# Patient Record
Sex: Female | Born: 1965 | Race: Black or African American | Hispanic: No | Marital: Single | Smoking: Former smoker
Health system: Southern US, Community
[De-identification: ages and names within clinical notes are randomized; demographics above are authoritative.]

## PROBLEM LIST (undated history)

## (undated) DIAGNOSIS — J45909 Unspecified asthma, uncomplicated: Secondary | ICD-10-CM

## (undated) DIAGNOSIS — E119 Type 2 diabetes mellitus without complications: Secondary | ICD-10-CM

## (undated) DIAGNOSIS — R42 Dizziness and giddiness: Secondary | ICD-10-CM

## (undated) DIAGNOSIS — I10 Essential (primary) hypertension: Secondary | ICD-10-CM

## (undated) HISTORY — PX: ABDOMINAL HYSTERECTOMY: SHX81

---

## 2016-10-15 ENCOUNTER — Encounter: Payer: Self-pay | Admitting: *Deleted

## 2016-10-15 ENCOUNTER — Ambulatory Visit
Admission: EM | Admit: 2016-10-15 | Discharge: 2016-10-15 | Disposition: A | Discharge status: Home / Self Care | Payer: BLUE CROSS/BLUE SHIELD | Attending: Family Medicine | Admitting: Family Medicine

## 2016-10-15 DIAGNOSIS — E119 Type 2 diabetes mellitus without complications: Secondary | ICD-10-CM | POA: Diagnosis not present

## 2016-10-15 DIAGNOSIS — Z7984 Long term (current) use of oral hypoglycemic drugs: Secondary | ICD-10-CM | POA: Diagnosis not present

## 2016-10-15 DIAGNOSIS — R42 Dizziness and giddiness: Secondary | ICD-10-CM | POA: Insufficient documentation

## 2016-10-15 DIAGNOSIS — Z79899 Other long term (current) drug therapy: Secondary | ICD-10-CM | POA: Diagnosis not present

## 2016-10-15 DIAGNOSIS — Z87891 Personal history of nicotine dependence: Secondary | ICD-10-CM | POA: Insufficient documentation

## 2016-10-15 DIAGNOSIS — I10 Essential (primary) hypertension: Secondary | ICD-10-CM | POA: Diagnosis not present

## 2016-10-15 HISTORY — DX: Essential (primary) hypertension: I10

## 2016-10-15 HISTORY — DX: Type 2 diabetes mellitus without complications: E11.9

## 2016-10-15 LAB — GLUCOSE, CAPILLARY: Glucose-Capillary: 99 mg/dL (ref 65–99)

## 2016-10-15 NOTE — Discharge Instructions (Signed)
Increase water intake

## 2016-10-15 NOTE — ED Triage Notes (Signed)
PAtient awoke this AM with dizziness without resolution.

## 2016-10-15 NOTE — ED Provider Notes (Signed)
[facility]    CSN: [id number] Arrival date & time: 10/15/16  1109     History   Chief Complaint Chief Complaint  Patient presents with  . Dizziness    HPI Dana Wagner is a 51 y.o. female.   50 yo female with a c/o lightheadedness/dizziness this morning as she was walking around in her home. States she suddenly felt "a little whoozy sensation" which she states has now resolved. She denies any chest pains, shortness of breath, fevers, chills, numbness/tingling, one-sided weakness, vision changes. States she drove herself here and now symptoms have resolved.    The history is provided by the patient.    Past Medical History:  Diagnosis Date  . Diabetes mellitus without complication (HCC)   . Hypertension     There are no active problems to display for this patient.   Past Surgical History:  Procedure Laterality Date  . ABDOMINAL HYSTERECTOMY      OB History    No data available       Home Medications    Prior to Admission medications   Medication Sig Start Date End Date Taking? Authorizing Provider  amLODipine (NORVASC) 10 MG tablet Take 10 mg by mouth daily.   Yes [provider]  aspirin EC 81 MG tablet Take 81 mg by mouth daily.   Yes [provider]  lisinopril (PRINIVIL,ZESTRIL) 5 MG tablet Take 5 mg by mouth daily.   Yes [provider]  metFORMIN (GLUCOPHAGE) 500 MG tablet Take 500 mg by mouth 2 (two) times daily with a meal.   Yes [provider]    Family History History reviewed. No pertinent family history.  Social History Social History  Substance Use Topics  . Smoking status: Former Games developer  . Smokeless tobacco: Never Used  . Alcohol use Yes     Allergies   Hctz [hydrochlorothiazide]   Review of Systems Review of Systems   Physical Exam Triage Vital Signs ED Triage Vitals  Enc Vitals Group     BP 10/15/16 1129 121/74     Pulse Rate 10/15/16 1129 74     Resp 10/15/16 1129 16       Temp 10/15/16 1129 97.8 F (36.6 C)     Temp Source 10/15/16 1129 Oral     SpO2 10/15/16 1129 99 %     Weight 10/15/16 1130 298 lb (135.2 kg)     Height 10/15/16 1130  (1.626 m)     Head Circumference --      Peak Flow --      Pain Score 10/15/16 1131 0     Pain Loc --      Pain Edu? --      Excl. in GC? --    No data found.   Updated Vital Signs BP 121/74 (BP Location: Left Arm)   Pulse 74   Temp 97.8 F (36.6 C) (Oral)   Resp 16   Ht  (1.626 m)   Wt 298 lb (135.2 kg)   SpO2 99%   BMI 51.15 kg/m   Visual Acuity Right Eye Distance:   Left Eye Distance:   Bilateral Distance:    Right Eye Near:   Left Eye Near:    Bilateral Near:     Physical Exam  Constitutional: She is oriented to person, place, and time. She appears well-developed and well-nourished. No distress.  HENT:  Head: Normocephalic.  Right Ear: Tympanic membrane, external ear and ear canal normal.  Left  Ear: Tympanic membrane, external ear and ear canal normal.  Nose: Nose normal.  Mouth/Throat: Oropharynx is clear and moist and mucous membranes are normal.  Eyes: Pupils are equal, round, and reactive to light. Conjunctivae and EOM are normal. Right eye exhibits no discharge. Left eye exhibits no discharge. No scleral icterus.  Neck: Normal range of motion. Neck supple. No JVD present. No tracheal deviation present. No thyromegaly present.  Cardiovascular: Normal rate, regular rhythm, normal heart sounds and intact distal pulses.   No murmur heard. Pulmonary/Chest: Effort normal and breath sounds normal. No stridor. No respiratory distress. She has no wheezes. She has no rales. She exhibits no tenderness.  Musculoskeletal: She exhibits no edema or tenderness.  Lymphadenopathy:    She has no cervical adenopathy.  Neurological: She is alert and oriented to person, place, and time. She has normal reflexes. She displays normal reflexes. No cranial nerve deficit or sensory deficit. She exhibits  normal muscle tone. Coordination normal.  Skin: Skin is warm and dry. No rash noted. She is not diaphoretic. No erythema. No pallor.  Psychiatric: She has a normal mood and affect.  Vitals reviewed.    UC Treatments / Results  Labs (all labs ordered are listed, but only abnormal results are displayed) Labs Reviewed  GLUCOSE, CAPILLARY  CBG MONITORING, ED    EKG  EKG Interpretation None       Radiology No results found.  Procedures Procedures (including critical care time)  Medications Ordered in UC Medications - No data to display   Initial Impression / Assessment and Plan / UC Course  I have reviewed the triage vital signs and the nursing notes.  Pertinent labs & imaging results that were available during my care of the patient were reviewed by me and considered in my medical decision making (see chart for details).       Final Clinical Impressions(s) / UC Diagnoses   Final diagnoses:  Lightheadedness  (resolved)  New Prescriptions Discharge Medication List as of 10/15/2016 12:57 PM     1. Lab (CBG=99) and diagnosis reviewed with patient 2. Recommend supportive treatment with increased fluids/water and monitor for recurrence of symptoms 3. Follow-up prn if symptoms worsen or don't improve  Controlled Substance Prescriptions [place] Controlled Substance Registry consulted? Not Applicable   Payton Mccallum, MD 10/15/16 [phone]

## 2016-11-26 ENCOUNTER — Ambulatory Visit
Admission: EM | Admit: 2016-11-26 | Discharge: 2016-11-26 | Disposition: A | Discharge status: Home / Self Care | Payer: BLUE CROSS/BLUE SHIELD | Attending: Emergency Medicine | Admitting: Emergency Medicine

## 2016-11-26 ENCOUNTER — Other Ambulatory Visit: Payer: Self-pay

## 2016-11-26 DIAGNOSIS — R51 Headache: Secondary | ICD-10-CM | POA: Insufficient documentation

## 2016-11-26 DIAGNOSIS — Z79899 Other long term (current) drug therapy: Secondary | ICD-10-CM | POA: Insufficient documentation

## 2016-11-26 DIAGNOSIS — Z87891 Personal history of nicotine dependence: Secondary | ICD-10-CM | POA: Diagnosis not present

## 2016-11-26 DIAGNOSIS — I10 Essential (primary) hypertension: Secondary | ICD-10-CM | POA: Insufficient documentation

## 2016-11-26 DIAGNOSIS — R42 Dizziness and giddiness: Secondary | ICD-10-CM | POA: Diagnosis not present

## 2016-11-26 DIAGNOSIS — E119 Type 2 diabetes mellitus without complications: Secondary | ICD-10-CM | POA: Insufficient documentation

## 2016-11-26 DIAGNOSIS — J45909 Unspecified asthma, uncomplicated: Secondary | ICD-10-CM | POA: Diagnosis not present

## 2016-11-26 DIAGNOSIS — R110 Nausea: Secondary | ICD-10-CM | POA: Diagnosis not present

## 2016-11-26 DIAGNOSIS — Z90710 Acquired absence of both cervix and uterus: Secondary | ICD-10-CM | POA: Insufficient documentation

## 2016-11-26 DIAGNOSIS — Z7982 Long term (current) use of aspirin: Secondary | ICD-10-CM | POA: Diagnosis not present

## 2016-11-26 DIAGNOSIS — Z888 Allergy status to other drugs, medicaments and biological substances status: Secondary | ICD-10-CM | POA: Diagnosis not present

## 2016-11-26 HISTORY — DX: Dizziness and giddiness: R42

## 2016-11-26 LAB — GLUCOSE, CAPILLARY: Glucose-Capillary: 100 mg/dL — ABNORMAL HIGH (ref 65–99)

## 2016-11-26 MED ORDER — DIAZEPAM 2 MG PO TABS: 1.0000 mg | ORAL_TABLET | Freq: Two times a day (BID) | ORAL | 0 refills | Status: AC | PRN

## 2016-11-26 NOTE — ED Provider Notes (Signed)
HPI  SUBJECTIVE:  Dana Wagner is a 51 y.o. female who presents with dizziness described as being off balance, that she feels like she is severely intoxicated.  This is been going on for 5 days, has been constant.  States that it waxes and wanes.  She reports occasional palpitations, mild headache starting yesterday.  However she was having the dizziness prior to the headache.  She reports intermittent, mild nausea. Patient was seen in the ER 5 days ago for dizziness.  Labs including tox screen, beta hCG, UA, CBC, CMP, troponin were unremarkable, EKG normal, she was given IV fluids with improvement in her symptoms.  She was thought to have postural dizziness with presyncope and vertigo.  She was prescribed meclizine.  Patient states that the meclizine is not working.  She denies vertigo as in the room spinning around her, chest pain, shortness of breath.  No syncope.  No dysarthria, aphasia, arm or leg weakness, facial droop, or severe headache.  No visual changes, vomiting, tinnitus.  No ear pain, fullness, recent viral illness.  She recently finished some Keflex for a lip sore, she does not take any NSAIDs, she is not on any diuretics.  No change in her medications.  She has tried meclizine.  Symptoms are better with IV fluids.  Symptoms are worse with turning her head, going from sitting to standing, walking.  She has a past medical history of asthma, hypertension, prediabetes, vertigo/dizziness.  She states that this episode is very similar to previous episodes however it is more intense than usual.  States that her dizziness is lasted for days in the past.  No history of coronary artery disease, hypercholesterolemia, peripheral vascular disease, peripheral arterial disease, aneurysm, arrhythmia, MI, stroke.  Family history negative for stroke.  PMD: PCP.  Past Medical History:  Diagnosis Date  . Diabetes mellitus without complication (HCC)   . Hypertension   . Vertigo     Past Surgical History:   Procedure Laterality Date  . ABDOMINAL HYSTERECTOMY      No family history on file.  Social History   Tobacco Use  . Smoking status: Former Games developermoker  . Smokeless tobacco: Never Used  Substance Use Topics  . Alcohol use: No    Frequency: Never  . Drug use: No    No current facility-administered medications for this encounter.   Current Outpatient Medications:  .  amLODipine (NORVASC) 10 MG tablet, Take 10 mg by mouth daily., Disp: , Rfl:  .  aspirin EC 81 MG tablet, Take 81 mg by mouth daily., Disp: , Rfl:  .  Fluticasone Furoate-Vilanterol (BREO ELLIPTA IN), Inhale into the lungs., Disp: , Rfl:  .  lisinopril (PRINIVIL,ZESTRIL) 5 MG tablet, Take 5 mg by mouth daily., Disp: , Rfl:  .  metFORMIN (GLUCOPHAGE) 500 MG tablet, Take 500 mg by mouth 2 (two) times daily with a meal., Disp: , Rfl:  .  diazepam (VALIUM) 2 MG tablet, Take 0.5 tablets (1 mg total) every 12 (twelve) hours as needed for up to 10 days by mouth (Vertigo/dizziness)., Disp: 10 tablet, Rfl: 0  Allergies  Allergen Reactions  . Hctz [Hydrochlorothiazide] Other (See Comments)    Causes headache     ROS  As noted in HPI.   Physical Exam  BP (!) 149/84 (BP Location: Left Arm)   Pulse 76   Temp (!) 97.5 F (36.4 C) (Oral)   Ht 5\' 4"  (1.626 m)   Wt 298 lb (135.2 kg)   SpO2 96%  BMI 51.15 kg/m   Orthostatic VS for the past 24 hrs:  BP- Lying Pulse- Lying BP- Sitting Pulse- Sitting BP- Standing at 0 minutes Pulse- Standing at 0 minutes  11/26/16 1005 144/84 82 149/86 81 147/80 85   Constitutional: Well developed, well nourished, no acute distress Eyes:  EOMI, conjunctiva normal bilaterally HENT: Normocephalic, atraumatic,mucus membranes moist Respiratory: Normal inspiratory effort, lungs clear bilaterally Cardiovascular: Normal rate and rhythm no murmurs rubs or gallops.  No carotid bruit GI: nondistended skin: No rash, skin intact Musculoskeletal: no deformities Neurologic: Alert & oriented x 3, no  nerves II through XII intact, finger-nose, heel shin within normal limits. Difficulty with tandem gait but able to do independently, romberg negative. Dix hallpike positive bilaterally, worse on the left.  Patient states that she feels better after the Dix-Hallpike. Psychiatric: Speech and behavior appropriate   ED Course   Medications - No data to display  Orders Placed This Encounter  Procedures  . Glucose, capillary    Standing Status:   Standing    Number of Occurrences:   1  . Orthostatic vital signs    Standing Status:   Standing    Number of Occurrences:   1  . CBG monitoring, ED    Standing Status:   Standing    Number of Occurrences:   1  . ED EKG    dizziness    Standing Status:   Standing    Number of Occurrences:   1    Order Specific Question:   Reason for Exam    Answer:   Other (See Comments)  . EKG 12-Lead    Standing Status:   Standing    Number of Occurrences:   1    Results for orders placed or performed during the hospital encounter of 11/26/16 (from the past 24 hour(s))  Glucose, capillary     Status: Abnormal   Collection Time: 11/26/16  9:49 AM  Result Value Ref Range   Glucose-Capillary 100 (H) 65 - 99 mg/dL   Comment 1 Document in Chart    No results found.  ED Clinical Impression  Dizziness   ED Assessment/Plan  Outside records reviewed as noted in HPI.  EKG: Normal sinus rhythm, normal axis, normal intervals.  No hypertrophy.  No ST-T wave changes.  No previous EKG for comparison  Patient is not orthostatic.   Gave patient the option of going to the ED to rule out central cause of her symptoms the meclizine has not been helping however, Favor peripheral cause of her vertigo given the reproducibility of her symptoms.   States that she feels better after doing the Dix-Hallpike.  She was dependently ambulatory without any problems after Dix-Hallpike.  She is completely neurologically intact, EKG is reassuring, glucose is normal.  She has had  a comprehensive workup recently, so labs were not repeated today.  Doubt cerebellar stroke as her cerebellar functions are intact. will try Valium instead of the meclizine, Epley maneuver on the left side-gave handout, follow-up with PMD as scheduled in 2 days.  She will go immediately to the ER for any worsening of her symptoms.  Discussed labs,  MDM, plan and followup with patient. Discussed sn/sx that should prompt return to the ED. patient agrees with plan.   Meds ordered this encounter  Medications  . DISCONTD: meclizine (ANTIVERT) 25 MG tablet    Sig: Take 25 mg 3 (three) times daily as needed by mouth for dizziness.  . Fluticasone Furoate-Vilanterol (BREO ELLIPTA IN)  Sig: Inhale into the lungs.  . diazepam (VALIUM) 2 MG tablet    Sig: Take 0.5 tablets (1 mg total) every 12 (twelve) hours as needed for up to 10 days by mouth (Vertigo/dizziness).    Dispense:  10 tablet    Refill:  0    *This clinic note was created using Scientist, clinical (histocompatibility and immunogenetics)Dragon dictation software. Therefore, there may be occasional mistakes despite careful proofreading.   ?    Domenick GongMortenson, Eyoel Throgmorton, MD 11/26/16 [id number]

## 2016-11-26 NOTE — Discharge Instructions (Signed)
Try the Valium instead of the meclizine.  Do the Epley maneuver on the left side as outlined in the handout.  Watch the video online for clarification on how to do this.  Go immediately to the ER if your symptoms get worse, do not respond to the Valium and Epley maneuver, chest pain, shortness of breath, drug-like symptoms, difficulty breathing or for other concerns

## 2016-11-26 NOTE — ED Triage Notes (Signed)
Patient c/o dizziness since Tuesday 11/22/16, she was [facility] on Tuesday and was given meclizine for dizziness. Patient states she had a slight sinus HA yesterday with slight nausea. No vomiting. Per patient, the meclizine has not given relief. Patient has a history of vertigo.Worse when standing and sitting up.

## 2017-01-30 ENCOUNTER — Other Ambulatory Visit: Payer: Self-pay

## 2017-01-30 ENCOUNTER — Ambulatory Visit
Admission: EM | Admit: 2017-01-30 | Discharge: 2017-01-30 | Disposition: A | Discharge status: Home / Self Care | Payer: BLUE CROSS/BLUE SHIELD | Attending: Emergency Medicine | Admitting: Emergency Medicine

## 2017-01-30 ENCOUNTER — Encounter: Payer: Self-pay | Admitting: Emergency Medicine

## 2017-01-30 DIAGNOSIS — R69 Illness, unspecified: Secondary | ICD-10-CM | POA: Diagnosis not present

## 2017-01-30 DIAGNOSIS — M791 Myalgia, unspecified site: Secondary | ICD-10-CM | POA: Diagnosis not present

## 2017-01-30 DIAGNOSIS — J111 Influenza due to unidentified influenza virus with other respiratory manifestations: Secondary | ICD-10-CM

## 2017-01-30 DIAGNOSIS — R0981 Nasal congestion: Secondary | ICD-10-CM | POA: Diagnosis not present

## 2017-01-30 MED ORDER — FLUTICASONE PROPIONATE 50 MCG/ACT NA SUSP: 1.0000 | Freq: Every day | NASAL | 2 refills | Status: DC

## 2017-01-30 MED ORDER — OSELTAMIVIR PHOSPHATE 75 MG PO CAPS: 75.0000 mg | ORAL_CAPSULE | Freq: Two times a day (BID) | ORAL | 0 refills | Status: DC

## 2017-01-30 NOTE — ED Provider Notes (Signed)
[facility]    CSN: [id number] Arrival date & time: 01/30/17  1625     History   Chief Complaint Chief Complaint  Patient presents with  . Generalized Body Aches  . Nasal Congestion    HPI Dana Wagner is a 52 y.o. female.   Patient is a 52 year old female who presents with complaint of body aches and cold-like symptoms that began Saturday but seemed to worsen yesterday.  She reports runny nose, sneezing, but denies cough.  She does report some sweating but no chills.  She does report a history of asthma.  She states she took a guaifenesin cough medication today about noon and took ibuprofen 600 mg about every 6 hours yesterday.  She denies any chest pains or shortness of breath above her normal but does report some occasional shortness of breath with exertion last couple days since she has been feeling well.  Patient denies any abdominal pain today but does report some nausea yesterday.        Past Medical History:  Diagnosis Date  . Diabetes mellitus without complication (HCC)   . Hypertension   . Vertigo     There are no active problems to display for this patient.   Past Surgical History:  Procedure Laterality Date  . ABDOMINAL HYSTERECTOMY      OB History    No data available       Home Medications    Prior to Admission medications   Medication Sig Start Date End Date Taking? Authorizing Provider  amLODipine (NORVASC) 10 MG tablet Take 10 mg by mouth daily.   Yes [provider]  aspirin EC 81 MG tablet Take 81 mg by mouth daily.   Yes [provider]  Fluticasone Furoate-Vilanterol (BREO ELLIPTA IN) Inhale into the lungs.   Yes [provider]  lisinopril (PRINIVIL,ZESTRIL) 5 MG tablet Take 5 mg by mouth daily.   Yes [provider]  metFORMIN (GLUCOPHAGE) 500 MG tablet Take 500 mg by mouth 2 (two) times daily with a meal.   Yes [provider]  fluticasone (FLONASE) 50 MCG/ACT nasal spray Place  1 spray into both nostrils daily. 01/30/17   Candis Schatz, PA-C  oseltamivir (TAMIFLU) 75 MG capsule Take 1 capsule (75 mg total) by mouth every 12 (twelve) hours. 01/30/17   Candis Schatz, PA-C    Family History History reviewed. No pertinent family history.  Social History Social History   Tobacco Use  . Smoking status: Former Games developer  . Smokeless tobacco: Never Used  Substance Use Topics  . Alcohol use: No    Frequency: Never  . Drug use: No     Allergies   Hctz [hydrochlorothiazide]   Review of Systems Review of Systems   As noted above in HPI.  Other systems reviewed and found to be negative.  Physical Exam Triage Vital Signs ED Triage Vitals  Enc Vitals Group     BP 01/30/17 1645 (!) 146/62     Pulse Rate 01/30/17 1645 80     Resp 01/30/17 1645 16     Temp 01/30/17 1645 98.6 F (37 C)     Temp Source 01/30/17 1645 Oral     SpO2 01/30/17 1645 97 %     Weight 01/30/17 1642 298 lb (135.2 kg)     Height --      Head Circumference --      Peak Flow --      Pain Score 01/30/17 1642 3  Pain Loc --      Pain Edu? --      Excl. in GC? --    No data found.  Updated Vital Signs BP (!) 146/62 (BP Location: Left Arm)   Pulse 80   Temp 98.6 F (37 C) (Oral)   Resp 16   Wt 298 lb (135.2 kg)   SpO2 97%   BMI 51.15 kg/m   Visual Acuity Right Eye Distance:   Left Eye Distance:   Bilateral Distance:    Right Eye Near:   Left Eye Near:    Bilateral Near:     Physical Exam  Constitutional: She is oriented to person, place, and time. She appears well-nourished. No distress.  HENT:  Right Ear: Ear canal normal. A middle ear effusion is present.  Left Ear: Ear canal normal. A middle ear effusion is present.  Nose: Right sinus exhibits no maxillary sinus tenderness and no frontal sinus tenderness. Left sinus exhibits no maxillary sinus tenderness and no frontal sinus tenderness.  Mouth/Throat: Uvula is midline. No posterior oropharyngeal erythema.  Tonsils are 0 on the right. Tonsils are 0 on the left.  Some postnasal drainage  Eyes: EOM are normal. Pupils are equal, round, and reactive to light.  Cardiovascular: Normal rate, regular rhythm and normal heart sounds.  No murmur heard. Pulmonary/Chest: Effort normal and breath sounds normal. No stridor. No respiratory distress. She has no wheezes.  Neurological: She is alert and oriented to person, place, and time.  Skin: Skin is warm and dry.     UC Treatments / Results  Labs (all labs ordered are listed, but only abnormal results are displayed) Labs Reviewed - No data to display  EKG  EKG Interpretation None       Radiology No results found.  Procedures Procedures (including critical care time)  Medications Ordered in UC Medications - No data to display   Initial Impression / Assessment and Plan / UC Course  I have reviewed the triage vital signs and the nursing notes.  Pertinent labs & imaging results that were available during my care of the patient were reviewed by me and considered in my medical decision making (see chart for details).     Final Clinical Impressions(s) / UC Diagnoses   Final diagnoses:  Influenza-like illness   Patient with flulike illness since Saturday, worse than yesterday.  We will go ahead and give her prescription for Tamiflu as well as for Flonase for her upper respiratory symptoms.  ED Discharge Orders        Ordered    oseltamivir (TAMIFLU) 75 MG capsule  Every 12 hours     01/30/17 1823    fluticasone (FLONASE) 50 MCG/ACT nasal spray  Daily     01/30/17 1823       Controlled Substance Prescriptions  Controlled Substance Registry consulted? Not Applicable   Candis SchatzHarris, Michael D, PA-C 01/30/17 1824

## 2017-01-30 NOTE — ED Triage Notes (Signed)
Patient c/o bodyaches, sneezing, and nasal congestion since yesterday.  Patient denies fevers.

## 2017-01-30 NOTE — Discharge Instructions (Signed)
-  Tamiflu: 1 tablet twice daily for 5 days -Flonase: 1 spray to each nostril directed slightly towards the ears daily -Tylenol or ibuprofen for pain or fever -Fluids and rest -Follow with primary care provider or this clinic should symptoms worsen or not improve.

## 2017-02-02 ENCOUNTER — Telehealth: Payer: Self-pay

## 2017-02-02 NOTE — Telephone Encounter (Signed)
Called to follow up with patient since visit here at [facility]. Patient instructed to call back with any questions or concerns. MAH  

## 2017-11-25 ENCOUNTER — Ambulatory Visit
Admission: EM | Admit: 2017-11-25 | Discharge: 2017-11-25 | Disposition: A | Discharge status: Home / Self Care | Payer: BLUE CROSS/BLUE SHIELD | Attending: Emergency Medicine | Admitting: Emergency Medicine

## 2017-11-25 ENCOUNTER — Other Ambulatory Visit: Payer: Self-pay

## 2017-11-25 ENCOUNTER — Ambulatory Visit (INDEPENDENT_AMBULATORY_CARE_PROVIDER_SITE_OTHER): Payer: BLUE CROSS/BLUE SHIELD

## 2017-11-25 DIAGNOSIS — J45901 Unspecified asthma with (acute) exacerbation: Secondary | ICD-10-CM

## 2017-11-25 DIAGNOSIS — J069 Acute upper respiratory infection, unspecified: Secondary | ICD-10-CM | POA: Diagnosis not present

## 2017-11-25 DIAGNOSIS — Z87891 Personal history of nicotine dependence: Secondary | ICD-10-CM | POA: Diagnosis not present

## 2017-11-25 LAB — RAPID INFLUENZA A&B ANTIGENS
Influenza A (ARMC): NEGATIVE
Influenza B (ARMC): NEGATIVE

## 2017-11-25 MED ORDER — IPRATROPIUM-ALBUTEROL 0.5-2.5 (3) MG/3ML IN SOLN
3.0000 mL | Freq: Once | RESPIRATORY_TRACT | Status: AC
  Administered 2017-11-25: 3 mL via RESPIRATORY_TRACT

## 2017-11-25 MED ORDER — HYDROCOD POLST-CPM POLST ER 10-8 MG/5ML PO SUER: 5.0000 mL | Freq: Two times a day (BID) | ORAL | 0 refills | Status: DC | PRN

## 2017-11-25 MED ORDER — AEROCHAMBER PLUS MISC: 2 refills | Status: DC

## 2017-11-25 NOTE — ED Triage Notes (Addendum)
Pt states runny nose, cough, sneezing, body aches. Seen by PCP on Thursday for same. No fever. Taking Z pack, Medrol dose pack, and Tessalon. Pain 6/10.

## 2017-11-25 NOTE — ED Provider Notes (Signed)
HPI  SUBJECTIVE:  Dana Wagner is a 52 y.o. female who presents with cough productive of clear yellow phlegm, chest congestion, tightness, wheezing, shortness of breath, dyspnea on exertion accompanied with URI symptoms of clear nasal congestion, rhinorrhea, postnasal drip.  She reports body aches, no documented fevers.  Symptoms have been going on for 3 days. Patient was seen by her primary care physician 2 days ago for the same, thought to have an asthma exacerbation.  Placed on a Z-Pak to prevent this from turning into pneumonia, Medrol Dosepak, albuterol inhaler/nebulizer.  Does not have a spacer.  States that she is breathing better, but wants to make sure that she does not have the flu or pneumonia.  She has been taking the Z-Pak, she is on day #3 of this, Medrol Dosepak, Tessalon, Flonase, Mucinex, ibuprofen 400 mg.  No alleviating factors.  Symptoms are worse with lying down.  No antipyretic in the past 4 to 6 hours.  She has a past medical history of asthma for which she takes Breo, and is using her albuterol rescue inhaler every 4 hours.  She has a history of hypertension for which she takes lisinopril.  She is a prediabetic.  No smoking, vaping.  PMD: Forest Gleason, MD   Past Medical History:  Diagnosis Date  . Diabetes mellitus without complication (HCC)   . Hypertension   . Vertigo     Past Surgical History:  Procedure Laterality Date  . ABDOMINAL HYSTERECTOMY      History reviewed. No pertinent family history.  Social History   Tobacco Use  . Smoking status: Former Games developer  . Smokeless tobacco: Never Used  Substance Use Topics  . Alcohol use: No    Frequency: Never  . Drug use: No    No current facility-administered medications for this encounter.   Current Outpatient Medications:  .  albuterol (PROVENTIL) (5 MG/ML) 0.5% nebulizer solution, Inhale into the lungs., Disp: , Rfl:  .  methylPREDNISolone (MEDROL DOSEPAK) 4 MG TBPK tablet, Follow package directions., Disp:  , Rfl:  .  amLODipine (NORVASC) 10 MG tablet, Take 10 mg by mouth daily., Disp: , Rfl:  .  azithromycin (ZITHROMAX) 250 MG tablet, TAKE 2 TABLETS BY MOUTH ON DAY 1 AND THEN TAKE 1 TABLET BY MOUTH ONCE A DAY ON DAY 2 THROUGH DAY 5, Disp: , Rfl: 0 .  benzonatate (TESSALON) 200 MG capsule, TAKE 1 CAPSULE BY MOUTH THREE TIMES DAILY AS NEEDED FOR COUGH, Disp: , Rfl: 1 .  chlorpheniramine-HYDROcodone (TUSSIONEX PENNKINETIC ER) 10-8 MG/5ML SUER, Take 5 mLs by mouth every 12 (twelve) hours as needed for cough., Disp: 60 mL, Rfl: 0 .  fluticasone (FLONASE) 50 MCG/ACT nasal spray, Place 1 spray into both nostrils daily., Disp: 16 g, Rfl: 2 .  Fluticasone Furoate-Vilanterol (BREO ELLIPTA IN), Inhale into the lungs., Disp: , Rfl:  .  lisinopril (PRINIVIL,ZESTRIL) 5 MG tablet, Take 5 mg by mouth daily., Disp: , Rfl:  .  metFORMIN (GLUCOPHAGE) 500 MG tablet, Take 500 mg by mouth 2 (two) times daily with a meal., Disp: , Rfl:  .  PROAIR HFA 108 (90 Base) MCG/ACT inhaler, , Disp: , Rfl: 4 .  Spacer/Aero-Holding Chambers (AEROCHAMBER PLUS) inhaler, Use as instructed, Disp: 1 each, Rfl: 2  Allergies  Allergen Reactions  . Hctz [Hydrochlorothiazide] Other (See Comments)    Causes headache     ROS  As noted in HPI.   Physical Exam  BP (!) 150/87 (BP Location: Left Arm)   Pulse 78  Temp 98.5 F (36.9 C) (Oral)   Resp 18   Ht 5\' 4"  (1.626 m)   Wt 136.1 kg   SpO2 97%   BMI 51.49 kg/m   Constitutional: Well developed, well nourished, no acute distress Eyes:  EOMI, conjunctiva normal bilaterally HENT: Normocephalic, atraumatic,mucus membranes moist. positive nasal congestion.  No sinus tenderness.  No obvious postnasal drip. Respiratory: Normal inspiratory effort fair air movement, wheezing throughout all lung fields.  Rhonchi right lower lobe.  Positive lateral chest wall tenderness  cardiovascular: Normal rate regular rhythm, no murmurs, rubs, gallops GI: nondistended skin: No rash, skin  intact Musculoskeletal: no deformities Neurologic: Alert & oriented x 3, no focal neuro deficits Psychiatric: Speech and behavior appropriate   ED Course   Medications  ipratropium-albuterol (DUONEB) 0.5-2.5 (3) MG/3ML nebulizer solution 3 mL (3 mLs Nebulization Given 11/25/17 0917)    Orders Placed This Encounter  Procedures  . Rapid Influenza A&B Antigens ([facility] only)    Standing Status:   Standing    Number of Occurrences:   1  . DG Chest 2 View    Standing Status:   Standing    Number of Occurrences:   1    Order Specific Question:   Reason for Exam (SYMPTOM  OR DIAGNOSIS REQUIRED)    Answer:   r/o pna  . Droplet precaution    Standing Status:   Standing    Number of Occurrences:   1    Results for orders placed or performed during the hospital encounter of 11/25/17 (from the past 24 hour(s))  Rapid Influenza A&B Antigens ([facility] only)     Status: None   Collection Time: 11/25/17  9:10 AM  Result Value Ref Range   Influenza A ([facility]) NEGATIVE NEGATIVE   Influenza B ([facility]) NEGATIVE NEGATIVE   Dg Chest 2 View  Result Date: 11/25/2017 CLINICAL DATA:  Rule out pneumonia.  Increasing shortness of breath. EXAM: CHEST - 2 VIEW COMPARISON:  None. FINDINGS: Mild elevation of the right hemidiaphragm. No focal airspace disease or pulmonary edema. Heart and mediastinum are within normal limits. Trachea is midline. No large pleural effusions. Bridging osteophytes in lower thoracic spine. IMPRESSION: No active cardiopulmonary disease. Electronically Signed   By: Richarda Overlie M.D.   On: 11/25/2017 09:18    ED Clinical Impression  Upper respiratory tract infection, unspecified type  Moderate asthma with acute exacerbation, unspecified whether persistent   ED Assessment/Plan  Outside records reviewed.  As noted in HPI.  Checking flu as it will change management, and chest x-ray due to the rhonchi in the right lower lobe, otherwise I agree with patient's current medication regimen, I  will have her continue Flonase, Mucinex, Medrol Dosepak, Z-Pak, Tessalon for the cough during the day, ibuprofen as needed. suspect URI triggering off an asthma exacerbation and the patient  has not fully responded to medications at this point.  I will prescribe her a spacer, advised regular albuterol 2 puffs every 4 hours, and Tussionex.  States that she does Not need a prescription of albuterol.  will reevaluate.  [place] Narcotic database reviewed for this patient, and feel that the risk/benefit ratio today is favorable for proceeding with a prescription for controlled substance.  No Opiate prescriptions in 2 years.  Reviewed imaging independently.  No acute cardiopulmonary disease per radiology.  see radiology report for full details.  Even if she had a pneumonia, I would continue her on the azithromycin at this point in time she is on day #3 of  the antibiotics.  She states that overall she is feeling better.  On reEvaluation, patient states that she feels better.  Continued wheezing, improved air movement.  Flu negative.  Plan as above.  She sates that she will follow-up with her primary care physician in 2 days.  Discussed labs, imaging, MDM, treatment plan, and plan for follow-up with patient. Discussed sn/sx that should prompt return to the ED. patient agrees with plan.   Meds ordered this encounter  Medications  . ipratropium-albuterol (DUONEB) 0.5-2.5 (3) MG/3ML nebulizer solution 3 mL  . Spacer/Aero-Holding Chambers (AEROCHAMBER PLUS) inhaler    Sig: Use as instructed    Dispense:  1 each    Refill:  2  . chlorpheniramine-HYDROcodone (TUSSIONEX PENNKINETIC ER) 10-8 MG/5ML SUER    Sig: Take 5 mLs by mouth every 12 (twelve) hours as needed for cough.    Dispense:  60 mL    Refill:  0    *This clinic note was created using Scientist, clinical (histocompatibility and immunogenetics)Dragon dictation software. Therefore, there may be occasional mistakes despite careful proofreading.   ?    Domenick GongMortenson, Jadi Deyarmin, MD 11/25/17 [phone]

## 2017-11-25 NOTE — Discharge Instructions (Addendum)
2 Puffs from your albuterol inhaler using your spacer every 4 hours.  Back off on this as you start to feel better.  Continue Flonase, Mucinex, Medrol Dosepak, Z-Pak, Tessalon for the cough during the day, ibuprofen as needed.  Tussionex for the cough at night.  Your primary care physician on Monday, especially if you are not feeling better, go to the ER if you start getting worse.

## 2018-01-29 ENCOUNTER — Other Ambulatory Visit: Payer: Self-pay

## 2018-01-29 ENCOUNTER — Ambulatory Visit
Admission: EM | Admit: 2018-01-29 | Discharge: 2018-01-29 | Disposition: A | Discharge status: Home / Self Care | Payer: BLUE CROSS/BLUE SHIELD | Attending: Family Medicine | Admitting: Family Medicine

## 2018-01-29 ENCOUNTER — Encounter: Payer: Self-pay | Admitting: Emergency Medicine

## 2018-01-29 DIAGNOSIS — M79622 Pain in left upper arm: Secondary | ICD-10-CM

## 2018-01-29 DIAGNOSIS — M542 Cervicalgia: Secondary | ICD-10-CM | POA: Diagnosis not present

## 2018-01-29 DIAGNOSIS — S29019A Strain of muscle and tendon of unspecified wall of thorax, initial encounter: Secondary | ICD-10-CM

## 2018-01-29 MED ORDER — CYCLOBENZAPRINE HCL 10 MG PO TABS: 10.0000 mg | ORAL_TABLET | Freq: Every day | ORAL | 0 refills | Status: AC

## 2018-01-29 NOTE — ED Triage Notes (Signed)
Pt c/o pain in her right axillary area and her left side of her neck. Started about 2 days ago. Denies fever, chills, chest pain, shortness of breath, or sweats.

## 2018-01-29 NOTE — ED Provider Notes (Signed)
[facility]    CSN: [id number] Arrival date & time: 01/29/18  0909     History   Chief Complaint Chief Complaint  Patient presents with  . axillary pain    left  . Neck Pain    HPI Dana Wagner is a 53 y.o. female.   53 yo female with a c/o left upper back and neck pain as well as left axillary pain. Denies any chest pain or shortness of breath. Denies any injuries, cough, fevers, chills.   The history is provided by the patient.  Neck Pain    Past Medical History:  Diagnosis Date  . Diabetes mellitus without complication (HCC)   . Hypertension   . Vertigo     There are no active problems to display for this patient.   Past Surgical History:  Procedure Laterality Date  . ABDOMINAL HYSTERECTOMY      OB History   No obstetric history on file.      Home Medications    Prior to Admission medications   Medication Sig Start Date End Date Taking? Authorizing Provider  albuterol (PROVENTIL) (5 MG/ML) 0.5% nebulizer solution Inhale into the lungs. 01/18/17 01/29/18 Yes [provider]  amLODipine (NORVASC) 10 MG tablet Take 10 mg by mouth daily.   Yes [provider]  fluticasone (FLONASE) 50 MCG/ACT nasal spray Place 1 spray into both nostrils daily. 01/30/17  Yes Candis Schatz, PA-C  Fluticasone Furoate-Vilanterol (BREO ELLIPTA IN) Inhale into the lungs.   Yes [provider]  lisinopril (PRINIVIL,ZESTRIL) 5 MG tablet Take 5 mg by mouth daily.   Yes [provider]  metFORMIN (GLUCOPHAGE) 500 MG tablet Take 500 mg by mouth 2 (two) times daily with a meal.   Yes [provider]  PROAIR HFA 108 (90 Base) MCG/ACT inhaler  11/21/17  Yes [provider]  rosuvastatin (CRESTOR) 5 MG tablet TAKE 1 TABLET BY MOUTH ONCE DAILY FOR CHOLESTEROL 01/24/18  Yes [provider]  Spacer/Aero-Holding Chambers (AEROCHAMBER PLUS) inhaler Use as instructed 11/25/17  Yes Domenick Gong, MD  azithromycin  (ZITHROMAX) 250 MG tablet TAKE 2 TABLETS BY MOUTH ON DAY 1 AND THEN TAKE 1 TABLET BY MOUTH ONCE A DAY ON DAY 2 THROUGH DAY 5 11/23/17   [provider]  benzonatate (TESSALON) 200 MG capsule TAKE 1 CAPSULE BY MOUTH THREE TIMES DAILY AS NEEDED FOR COUGH 11/23/17   [provider]  chlorpheniramine-HYDROcodone (TUSSIONEX PENNKINETIC ER) 10-8 MG/5ML SUER Take 5 mLs by mouth every 12 (twelve) hours as needed for cough. 11/25/17   Domenick Gong, MD  cyclobenzaprine (FLEXERIL) 10 MG tablet Take 1 tablet (10 mg total) by mouth at bedtime. 01/29/18   Payton Mccallum, MD    Family History Family History  Problem Relation Age of Onset  . Cancer Mother        lung  . Pneumonia Father     Social History Social History   Tobacco Use  . Smoking status: Former Games developer  . Smokeless tobacco: Never Used  Substance Use Topics  . Alcohol use: No    Frequency: Never  . Drug use: No     Allergies   Hctz [hydrochlorothiazide]   Review of Systems Review of Systems  Musculoskeletal: Positive for neck pain.     Physical Exam Triage Vital Signs ED Triage Vitals  Enc Vitals Group     BP 01/29/18 0932 138/72     Pulse Rate 01/29/18 0932 76     Resp 01/29/18 0932  20     Temp 01/29/18 0932 98.4 F (36.9 C)     Temp Source 01/29/18 0932 Oral     SpO2 01/29/18 0932 95 %     Weight 01/29/18 0927 (!) 305 lb (138.3 kg)     Height 01/29/18 0927 5\' 4"  (1.626 m)     Head Circumference --      Peak Flow --      Pain Score 01/29/18 0927 8     Pain Loc --      Pain Edu? --      Excl. in GC? --    No data found.  Updated Vital Signs BP 138/72 (BP Location: Left Arm)   Pulse 76   Temp 98.4 F (36.9 C) (Oral)   Resp 20   Ht 5\' 4"  (1.626 m)   Wt (!) 138.3 kg   SpO2 95%   BMI 52.35 kg/m   Visual Acuity Right Eye Distance:   Left Eye Distance:   Bilateral Distance:    Right Eye Near:   Left Eye Near:    Bilateral Near:     Physical Exam Vitals signs and nursing  note reviewed.  Constitutional:      General: She is not in acute distress.    Appearance: Normal appearance. She is well-developed. She is not ill-appearing, toxic-appearing or diaphoretic.  HENT:     Head: Normocephalic and atraumatic.  Neck:     Musculoskeletal: Normal range of motion and neck supple.     Thyroid: No thyromegaly.     Trachea: No tracheal deviation.  Cardiovascular:     Rate and Rhythm: Normal rate and regular rhythm.     Heart sounds: Normal heart sounds.  Pulmonary:     Effort: Pulmonary effort is normal. No respiratory distress.     Breath sounds: Normal breath sounds. No stridor. No wheezing, rhonchi or rales.  Musculoskeletal:     Cervical back: She exhibits tenderness (over the left trapezius muscle and cervical paraspinous muscles), pain and spasm. She exhibits normal range of motion, no bony tenderness, no swelling, no edema, no deformity, no laceration and normal pulse.  Lymphadenopathy:     Cervical: No cervical adenopathy.  Neurological:     Mental Status: She is alert and oriented to person, place, and time.     Cranial Nerves: No cranial nerve deficit.     Motor: No abnormal muscle tone.     Coordination: Coordination normal.     Deep Tendon Reflexes: Reflexes are normal and symmetric.      UC Treatments / Results  Labs (all labs ordered are listed, but only abnormal results are displayed) Labs Reviewed - No data to display  EKG None  Radiology No results found.  Procedures Procedures (including critical care time)  Medications Ordered in UC Medications - No data to display  Initial Impression / Assessment and Plan / UC Course  I have reviewed the triage vital signs and the nursing notes.  Pertinent labs & imaging results that were available during my care of the patient were reviewed by me and considered in my medical decision making (see chart for details).      Final Clinical Impressions(s) / UC Diagnoses   Final diagnoses:    Thoracic myofascial strain, initial encounter  Neck pain  Axillary pain, left     Discharge Instructions     Ibuprofen 600mg  three times daily Heating pad to left upper back area    ED Prescriptions    Medication  Sig Dispense Auth. Provider   cyclobenzaprine (FLEXERIL) 10 MG tablet Take 1 tablet (10 mg total) by mouth at bedtime. 30 tablet Payton Mccallumonty, Kalyse Meharg, MD      1. diagnosis reviewed with patient 2. rx as per orders above; reviewed possible side effects, interactions, risks and benefits  3. Recommend supportive treatment as above 4. Follow-up prn if symptoms worsen or don't improve   Controlled Substance Prescriptions [place] Controlled Substance Registry consulted? Not Applicable   Payton Mccallumonty, Guila Owensby, MD 01/29/18 1018

## 2018-01-29 NOTE — Discharge Instructions (Addendum)
Ibuprofen 600mg  three times daily Heating pad to left upper back area

## 2018-04-03 ENCOUNTER — Encounter: Payer: Self-pay | Admitting: Emergency Medicine

## 2018-04-03 ENCOUNTER — Other Ambulatory Visit: Payer: Self-pay

## 2018-04-03 ENCOUNTER — Emergency Department: Payer: BLUE CROSS/BLUE SHIELD

## 2018-04-03 ENCOUNTER — Emergency Department
Admission: EM | Admit: 2018-04-03 | Discharge: 2018-04-03 | Disposition: A | Discharge status: Home / Self Care | Payer: BLUE CROSS/BLUE SHIELD | Attending: Emergency Medicine | Admitting: Emergency Medicine

## 2018-04-03 DIAGNOSIS — J45909 Unspecified asthma, uncomplicated: Secondary | ICD-10-CM | POA: Insufficient documentation

## 2018-04-03 DIAGNOSIS — I10 Essential (primary) hypertension: Secondary | ICD-10-CM | POA: Diagnosis not present

## 2018-04-03 DIAGNOSIS — R000 Tachycardia, unspecified: Secondary | ICD-10-CM | POA: Insufficient documentation

## 2018-04-03 DIAGNOSIS — Z79899 Other long term (current) drug therapy: Secondary | ICD-10-CM | POA: Insufficient documentation

## 2018-04-03 HISTORY — DX: Unspecified asthma, uncomplicated: J45.909

## 2018-04-03 LAB — COMPREHENSIVE METABOLIC PANEL
ALK PHOS: 54 U/L (ref 38–126)
ALT: 28 U/L (ref 0–44)
AST: 27 U/L (ref 15–41)
Albumin: 4.2 g/dL (ref 3.5–5.0)
Anion gap: 11 (ref 5–15)
BILIRUBIN TOTAL: 0.5 mg/dL (ref 0.3–1.2)
BUN: 15 mg/dL (ref 6–20)
CALCIUM: 9 mg/dL (ref 8.9–10.3)
CO2: 23 mmol/L (ref 22–32)
CREATININE: 0.53 mg/dL (ref 0.44–1.00)
Chloride: 105 mmol/L (ref 98–111)
GFR calc Af Amer: >60 mL/min (ref >60)
GFR calc non Af Amer: >60 mL/min (ref >60)
GLUCOSE: 136 mg/dL — AB (ref 70–99)
Potassium: 3.9 mmol/L (ref 3.5–5.1)
Sodium: 139 mmol/L (ref 135–145)
TOTAL PROTEIN: 8.7 g/dL — AB (ref 6.5–8.1)

## 2018-04-03 LAB — CBC WITH DIFFERENTIAL/PLATELET
Abs Immature Granulocytes: 0.02 10*3/uL (ref 0.00–0.07)
Basophils Absolute: 0 10*3/uL (ref 0.0–0.1)
Basophils Relative: 0 %
EOS PCT: 4 %
Eosinophils Absolute: 0.2 10*3/uL (ref 0.0–0.5)
HEMATOCRIT: 38.9 % (ref 36.0–46.0)
HEMOGLOBIN: 13.3 g/dL (ref 12.0–15.0)
Immature Granulocytes: 0 %
LYMPHS PCT: 45 %
Lymphs Abs: 2.1 10*3/uL (ref 0.7–4.0)
MCH: 31.1 pg (ref 26.0–34.0)
MCHC: 34.2 g/dL (ref 30.0–36.0)
MCV: 90.9 fL (ref 80.0–100.0)
MONO ABS: 0.3 10*3/uL (ref 0.1–1.0)
MONOS PCT: 6 %
Neutro Abs: 2.1 10*3/uL (ref 1.7–7.7)
Neutrophils Relative %: 45 %
Platelets: 196 10*3/uL (ref 150–400)
RBC: 4.28 MIL/uL (ref 3.87–5.11)
RDW: 13.7 % (ref 11.5–15.5)
WBC: 4.7 10*3/uL (ref 4.0–10.5)
nRBC: 0 % (ref 0.0–0.2)

## 2018-04-03 LAB — GLUCOSE, CAPILLARY: GLUCOSE-CAPILLARY: 126 mg/dL — AB (ref 70–99)

## 2018-04-03 LAB — URINALYSIS, COMPLETE (UACMP) WITH MICROSCOPIC
BACTERIA UA: NONE SEEN
BILIRUBIN URINE: NEGATIVE
GLUCOSE, UA: NEGATIVE mg/dL
Hgb urine dipstick: NEGATIVE
Ketones, ur: NEGATIVE mg/dL
Leukocytes,Ua: NEGATIVE
Nitrite: NEGATIVE
PH: 5 (ref 5.0–8.0)
Protein, ur: NEGATIVE mg/dL
SPECIFIC GRAVITY, URINE: 1.015 (ref 1.005–1.030)

## 2018-04-03 LAB — TROPONIN I
Troponin I: <0.03 ng/mL (ref <0.03)
Troponin I: <0.03 ng/mL (ref <0.03)

## 2018-04-03 LAB — TSH: TSH: 2.427 u[IU]/mL (ref 0.350–4.500)

## 2018-04-03 MED ORDER — AMLODIPINE BESYLATE 5 MG PO TABS
10.0000 mg | ORAL_TABLET | Freq: Every day | ORAL | Status: DC
  Administered 2018-04-03: 10 mg via ORAL
  Filled 2018-04-03: qty 2

## 2018-04-03 MED ORDER — METFORMIN HCL 500 MG PO TABS
500.0000 mg | ORAL_TABLET | Freq: Two times a day (BID) | ORAL | Status: DC
  Administered 2018-04-03: 500 mg via ORAL
  Filled 2018-04-03: qty 1

## 2018-04-03 MED ORDER — FLUTICASONE FUROATE-VILANTEROL 100-25 MCG/INH IN AEPB
1.0000 | INHALATION_SPRAY | Freq: Every day | RESPIRATORY_TRACT | Status: DC
  Administered 2018-04-03: 1 via RESPIRATORY_TRACT
  Filled 2018-04-03 (×2): qty 28

## 2018-04-03 MED ORDER — LISINOPRIL 5 MG PO TABS
5.0000 mg | ORAL_TABLET | Freq: Every day | ORAL | Status: DC
  Administered 2018-04-03: 5 mg via ORAL
  Filled 2018-04-03: qty 1

## 2018-04-03 MED ORDER — SODIUM CHLORIDE 0.9 % IV BOLUS
1000.0000 mL | Freq: Once | INTRAVENOUS | Status: AC
  Administered 2018-04-03: 1000 mL via INTRAVENOUS

## 2018-04-03 NOTE — ED Notes (Signed)
Pt denies N/V/ SHOB at this time. Pt is A/Ox4 at this time. NAD noted a this time.

## 2018-04-03 NOTE — ED Provider Notes (Signed)
Repeat troponin is negative, she is cleared for outpatient follow-up.   Emily Filbert, MD 04/03/18 [phone]

## 2018-04-03 NOTE — ED Triage Notes (Addendum)
Pt presents to ED via EMS from home after waking up with what felt like a racing heart rate. Denies cp or sob. Pt alert and slightly anxious during triage. Hx of the same about a year ago. HR 140 upon EMS arrival.

## 2018-04-03 NOTE — ED Notes (Signed)
Patient denies pain and is resting comfortably.  

## 2018-04-03 NOTE — ED Provider Notes (Signed)
[facility] Emergency Department Provider Note   ____________________________________________   First MD Initiated Contact with Patient 04/03/18 0505     (approximate)  I have reviewed the triage vital signs and the nursing notes.   HISTORY  Chief Complaint Tachycardia   HPI Dana Wagner is a 53 y.o. female patient reports she was sleeping and woke up with her heart racing.  She denies any shortness of breath or chest tightness or pain.  She has no pain elsewhere either.  She has no bleeding.  She has had a hysterectomy so should not be pregnant.  She does not remember having a nightmare.         Past Medical History:  Diagnosis Date  . Asthma   . Diabetes mellitus without complication (HCC)   . Hypertension   . Vertigo     There are no active problems to display for this patient.   Past Surgical History:  Procedure Laterality Date  . ABDOMINAL HYSTERECTOMY      Prior to Admission medications   Medication Sig Start Date End Date Taking? Authorizing Provider  albuterol (PROVENTIL) (5 MG/ML) 0.5% nebulizer solution Inhale into the lungs. 01/18/17 01/29/18  [provider]  amLODipine (NORVASC) 10 MG tablet Take 10 mg by mouth daily.    [provider]  azithromycin (ZITHROMAX) 250 MG tablet TAKE 2 TABLETS BY MOUTH ON DAY 1 AND THEN TAKE 1 TABLET BY MOUTH ONCE A DAY ON DAY 2 THROUGH DAY 5 11/23/17   [provider]  benzonatate (TESSALON) 200 MG capsule TAKE 1 CAPSULE BY MOUTH THREE TIMES DAILY AS NEEDED FOR COUGH 11/23/17   [provider]  chlorpheniramine-HYDROcodone (TUSSIONEX PENNKINETIC ER) 10-8 MG/5ML SUER Take 5 mLs by mouth every 12 (twelve) hours as needed for cough. 11/25/17   Domenick Gong, MD  cyclobenzaprine (FLEXERIL) 10 MG tablet Take 1 tablet (10 mg total) by mouth at bedtime. 01/29/18   Payton Mccallum, MD  fluticasone (FLONASE) 50 MCG/ACT nasal spray Place 1 spray into both nostrils daily.  01/30/17   Candis Schatz, PA-C  Fluticasone Furoate-Vilanterol (BREO ELLIPTA IN) Inhale into the lungs.    [provider]  lisinopril (PRINIVIL,ZESTRIL) 5 MG tablet Take 5 mg by mouth daily.    [provider]  metFORMIN (GLUCOPHAGE) 500 MG tablet Take 500 mg by mouth 2 (two) times daily with a meal.    [provider]  PROAIR HFA 108 [phone] Base) MCG/ACT inhaler  11/21/17   [provider]  rosuvastatin (CRESTOR) 5 MG tablet TAKE 1 TABLET BY MOUTH ONCE DAILY FOR CHOLESTEROL 01/24/18   [provider]  Spacer/Aero-Holding Chambers (AEROCHAMBER PLUS) inhaler Use as instructed 11/25/17   Domenick Gong, MD    Allergies Hctz [hydrochlorothiazide]  Family History  Problem Relation Age of Onset  . Cancer Mother        lung  . Pneumonia Father     Social History Social History   Tobacco Use  . Smoking status: Former Games developer  . Smokeless tobacco: Never Used  Substance Use Topics  . Alcohol use: Yes    Frequency: Never  . Drug use: No    Review of Systems  Constitutional: No fever/chills Eyes: No visual changes. ENT: No sore throat. Cardiovascular: Denies chest pain. Respiratory: Denies shortness of breath. Gastrointestinal: No abdominal pain.  No nausea, no vomiting.  No diarrhea.  No constipation. Genitourinary: Negative for dysuria. Musculoskeletal: Negative for back pain. Skin: Negative for rash. Neurological: Negative for headaches,  focal weakness   ____________________________________________   PHYSICAL EXAM:  VITAL SIGNS: ED Triage Vitals  Enc Vitals Group     BP 04/03/18 0507 (!) 163/52     Pulse Rate 04/03/18 0507 (!) 108     Resp 04/03/18 0506 20     Temp 04/03/18 0506 98.2 F (36.8 C)     Temp Source 04/03/18 0506 Oral     SpO2 04/03/18 0507 99 %     Weight 04/03/18 0506 299 lb (135.6 kg)     Height 04/03/18 0506  (1.626 m)     Head Circumference --      Peak Flow --      Pain Score 04/03/18 0506 0      Pain Loc --      Pain Edu? --      Excl. in GC? --     Constitutional: Alert and oriented. Well appearing and in no acute distress.  He does look a little anxious though. Eyes: Conjunctivae are normal.  Head: Atraumatic. Nose: No congestion/rhinnorhea. Mouth/Throat: Mucous membranes are moist.  Oropharynx non-erythematous. Neck: No stridor.  Cardiovascular: Normal rate, regular rhythm. Grossly normal heart sounds.  Good peripheral circulation. Respiratory: Normal respiratory effort.  No retractions. Lungs CTAB. Gastrointestinal: Soft and nontender. No distention. No abdominal bruits. No CVA tenderness. Musculoskeletal: No lower extremity tenderness nor edema.   Neurologic:  Normal speech and language. No gross focal neurologic deficits are appreciated.  Skin:  Skin is warm, dry and intact. No rash noted. Psychiatric: Mood and affect are normal. Speech and behavior are normal.  ____________________________________________   LABS (all labs ordered are listed, but only abnormal results are displayed)  Labs Reviewed  COMPREHENSIVE METABOLIC PANEL - Abnormal; Notable for the following components:      Result Value   Glucose, Bld 136 (*)    Total Protein 8.7 (*)    All other components within normal limits  GLUCOSE, CAPILLARY - Abnormal; Notable for the following components:   Glucose-Capillary 126 (*)    All other components within normal limits  TROPONIN I  CBC WITH DIFFERENTIAL/PLATELET  TSH  URINALYSIS, COMPLETE (UACMP) WITH MICROSCOPIC  TROPONIN I   ____________________________________________  EKG  EKG read and interpreted by me shows sinus tachycardia rate of 106 normal axis nonspecific ST-T changes EKGs from EMS reviewed as well heart rate there is 122.  Compared to EKG from 11/26/2016 there is a small amount of T wave flattening and inversion in lateral chest leads.  _________________________________________  RADIOLOGY  ED MD interpretation: Chest x-ray read by  radiology reviewed by me shows no acute disease  Official radiology report(s): Dg Chest Portable 1 View  Result Date: 04/03/2018 CLINICAL DATA:  Tachycardia EXAM: PORTABLE CHEST 1 VIEW COMPARISON:  11/25/2017 FINDINGS: The heart size and mediastinal contours are within normal limits. Both lungs are clear. The visualized skeletal structures are unremarkable. IMPRESSION: No active disease. Electronically Signed   By: Deatra Robinson M.D.   On: 04/03/2018 06:02    ____________________________________________   PROCEDURES  Procedure(s) performed (including Critical Care):  Procedures   ____________________________________________   INITIAL IMPRESSION / ASSESSMENT AND PLAN / ED COURSE  Patient's TSH is normal.  Patient was never seen to be in anything but sinus tachycardia.   Patient signed out to Dr. Mayford Knife pending second troponin          ____________________________________________   FINAL CLINICAL IMPRESSION(S) / ED DIAGNOSES  Final diagnoses:  Tachycardia     ED Discharge Orders  None       Note:  This document was prepared using Dragon voice recognition software and may include unintentional dictation errors.    Arnaldo Natal, MD 04/03/18 [phone]

## 2018-04-03 NOTE — Discharge Instructions (Signed)
Not sure why your heart was beating so fast.  Please schedule follow-up appointment with a cardiologist.  Dr. Gwen Pounds is on-call today.  He can call his office later this morning and schedule follow-up today or tomorrow.  Please return if it happens again.

## 2018-05-02 ENCOUNTER — Encounter: Payer: Self-pay | Admitting: Emergency Medicine

## 2018-05-02 ENCOUNTER — Ambulatory Visit
Admission: EM | Admit: 2018-05-02 | Discharge: 2018-05-02 | Disposition: A | Discharge status: Home / Self Care | Payer: BLUE CROSS/BLUE SHIELD | Attending: Family Medicine | Admitting: Family Medicine

## 2018-05-02 ENCOUNTER — Other Ambulatory Visit: Payer: Self-pay

## 2018-05-02 DIAGNOSIS — H81399 Other peripheral vertigo, unspecified ear: Secondary | ICD-10-CM

## 2018-05-02 MED ORDER — DIAZEPAM 2 MG PO TABS: 1.0000 mg | ORAL_TABLET | Freq: Two times a day (BID) | ORAL | 0 refills | Status: AC | PRN

## 2018-05-02 NOTE — Discharge Instructions (Signed)
Medication as prescribed.  Try Epley maneuver at home.  Take care  Dr. Syrianna Schillaci  

## 2018-05-02 NOTE — ED Provider Notes (Signed)
[facility]    CSN: [id number] Arrival date & time: 05/02/18  0803  History   Chief Complaint Chief Complaint  Patient presents with  . Dizziness   HPI  53 year old female presents with dizziness.  Patient has a history of vertigo.  Patient reports that she woke up this morning and experienced dizziness.  Patient describes it as feeling off balance.  She also has a perception of movement when she is at rest.  Associated nausea.  No medications tried.  She does have meclizine at home.  Patient states that it is better when she is at rest.  Worse with movement.  Worse particularly with lying down.  Moderate in severity.  No other associated symptoms.  No other complaints.  PMH, Surgical Hx, Family Hx, Social History reviewed and updated as below.  Past Medical History:  Diagnosis Date  . Asthma   . Diabetes mellitus without complication (HCC)   . Hypertension   . Vertigo    Past Surgical History:  Procedure Laterality Date  . ABDOMINAL HYSTERECTOMY     OB History   No obstetric history on file.     Home Medications    Prior to Admission medications   Medication Sig Start Date End Date Taking? Authorizing Provider  amLODipine (NORVASC) 10 MG tablet Take 10 mg by mouth daily.   Yes [provider]  Fluticasone Furoate-Vilanterol (BREO ELLIPTA IN) Inhale into the lungs.   Yes [provider]  lisinopril (PRINIVIL,ZESTRIL) 5 MG tablet Take 5 mg by mouth daily.   Yes [provider]  metFORMIN (GLUCOPHAGE) 500 MG tablet Take 500 mg by mouth 2 (two) times daily with a meal.   Yes [provider]  PROAIR HFA 108 (90 Base) MCG/ACT inhaler  11/21/17  Yes [provider]  rosuvastatin (CRESTOR) 5 MG tablet TAKE 1 TABLET BY MOUTH ONCE DAILY FOR CHOLESTEROL 01/24/18  Yes [provider]  albuterol (PROVENTIL) (5 MG/ML) 0.5% nebulizer solution Inhale into the lungs. 01/18/17 01/29/18  [provider]   cyclobenzaprine (FLEXERIL) 10 MG tablet Take 1 tablet (10 mg total) by mouth at bedtime. 01/29/18   Payton Mccallumonty, [place], MD  diazepam (VALIUM) 2 MG tablet Take 0.5-1 tablets (1-2 mg total) by mouth every 12 (twelve) hours as needed (Vertigo). 05/02/18   Tommie Samsook, Ameri Cahoon G, DO    Family History Family History  Problem Relation Age of Onset  . Cancer Mother        lung  . Pneumonia Father     Social History Social History   Tobacco Use  . Smoking status: Former Games developermoker  . Smokeless tobacco: Never Used  Substance Use Topics  . Alcohol use: Yes    Frequency: Never  . Drug use: No     Allergies   Hctz [hydrochlorothiazide]   Review of Systems Review of Systems  Gastrointestinal: Positive for nausea.  Neurological: Positive for dizziness.   Physical Exam Triage Vital Signs ED Triage Vitals  Enc Vitals Group     BP 05/02/18 0822 138/90     Pulse Rate 05/02/18 0822 72     Resp 05/02/18 0822 18     Temp 05/02/18 0822 98.4 F (36.9 C)     Temp Source 05/02/18 0822 Oral     SpO2 05/02/18 0822 99 %     Weight 05/02/18 0820 293 lb (132.9 kg)     Height 05/02/18 0820 5\' 4"  (1.626 m)     Head Circumference --  Peak Flow --      Pain Score 05/02/18 0820 0     Pain Loc --      Pain Edu? --      Excl. in [place]? --    Updated Vital Signs BP 138/90 (BP Location: Right Arm)   Pulse 72   Temp 98.4 F (36.9 C) (Oral)   Resp 18   Ht 5\' 4"  (1.626 m)   Wt 132.9 kg   SpO2 99%   BMI 50.29 kg/m   Visual Acuity Right Eye Distance:   Left Eye Distance:   Bilateral Distance:    Right Eye Near:   Left Eye Near:    Bilateral Near:     Physical Exam Vitals signs and nursing note reviewed.  Constitutional:      General: She is not in acute distress.    Appearance: Normal appearance. She is obese.  HENT:     Head: Normocephalic and atraumatic.  Eyes:     General:        Right eye: No discharge.        Left eye: No discharge.     Extraocular Movements: Extraocular movements  intact.     Conjunctiva/sclera: Conjunctivae normal.     Pupils: Pupils are equal, round, and reactive to light.  Cardiovascular:     Rate and Rhythm: Normal rate and regular rhythm.  Pulmonary:     Effort: Pulmonary effort is normal.     Breath sounds: Normal breath sounds.  Neurological:     Mental Status: She is alert.     Comments: No nystagmus noted with testing of EOM.  Psychiatric:        Mood and Affect: Mood normal.        Behavior: Behavior normal.    UC Treatments / Results  Labs (all labs ordered are listed, but only abnormal results are displayed) Labs Reviewed - No data to display  EKG None  Radiology No results found.  Procedures Procedures (including critical care time)  Medications Ordered in UC Medications - No data to display  Initial Impression / Assessment and Plan / UC Course  I have reviewed the triage vital signs and the nursing notes.  Pertinent labs & imaging results that were available during my care of the patient were reviewed by me and considered in my medical decision making (see chart for details).    53 year old female presents with vertigo.  Treating with valium. Information given regarding Epley maneuver. Work note given.   Final Clinical Impressions(s) / UC Diagnoses   Final diagnoses:  Peripheral vertigo, unspecified laterality     Discharge Instructions     Medication as prescribed.  Try Epley maneuver at home.  Take care  Dr. Adriana Simas    ED Prescriptions    Medication Sig Dispense Auth. Provider   diazepam (VALIUM) 2 MG tablet Take 0.5-1 tablets (1-2 mg total) by mouth every 12 (twelve) hours as needed (Vertigo). 10 tablet Tommie Sams, DO     Controlled Substance Prescriptions [place] Controlled Substance Registry consulted? Not Applicable   Tommie Sams, [place] 05/02/18 2751

## 2018-05-02 NOTE — ED Triage Notes (Signed)
Patient c/o vertigo that started this morning. Patient states this has been intermittent for a few weeks.

## 2020-04-23 IMAGING — CR DG CHEST 2V
2 series · 2 of 2 positions shown · non-contrast
Comparison: None.

CLINICAL DATA: Rule out pneumonia.  Increasing shortness of breath.

EXAM:
CHEST - 2 VIEW

[chest pa]
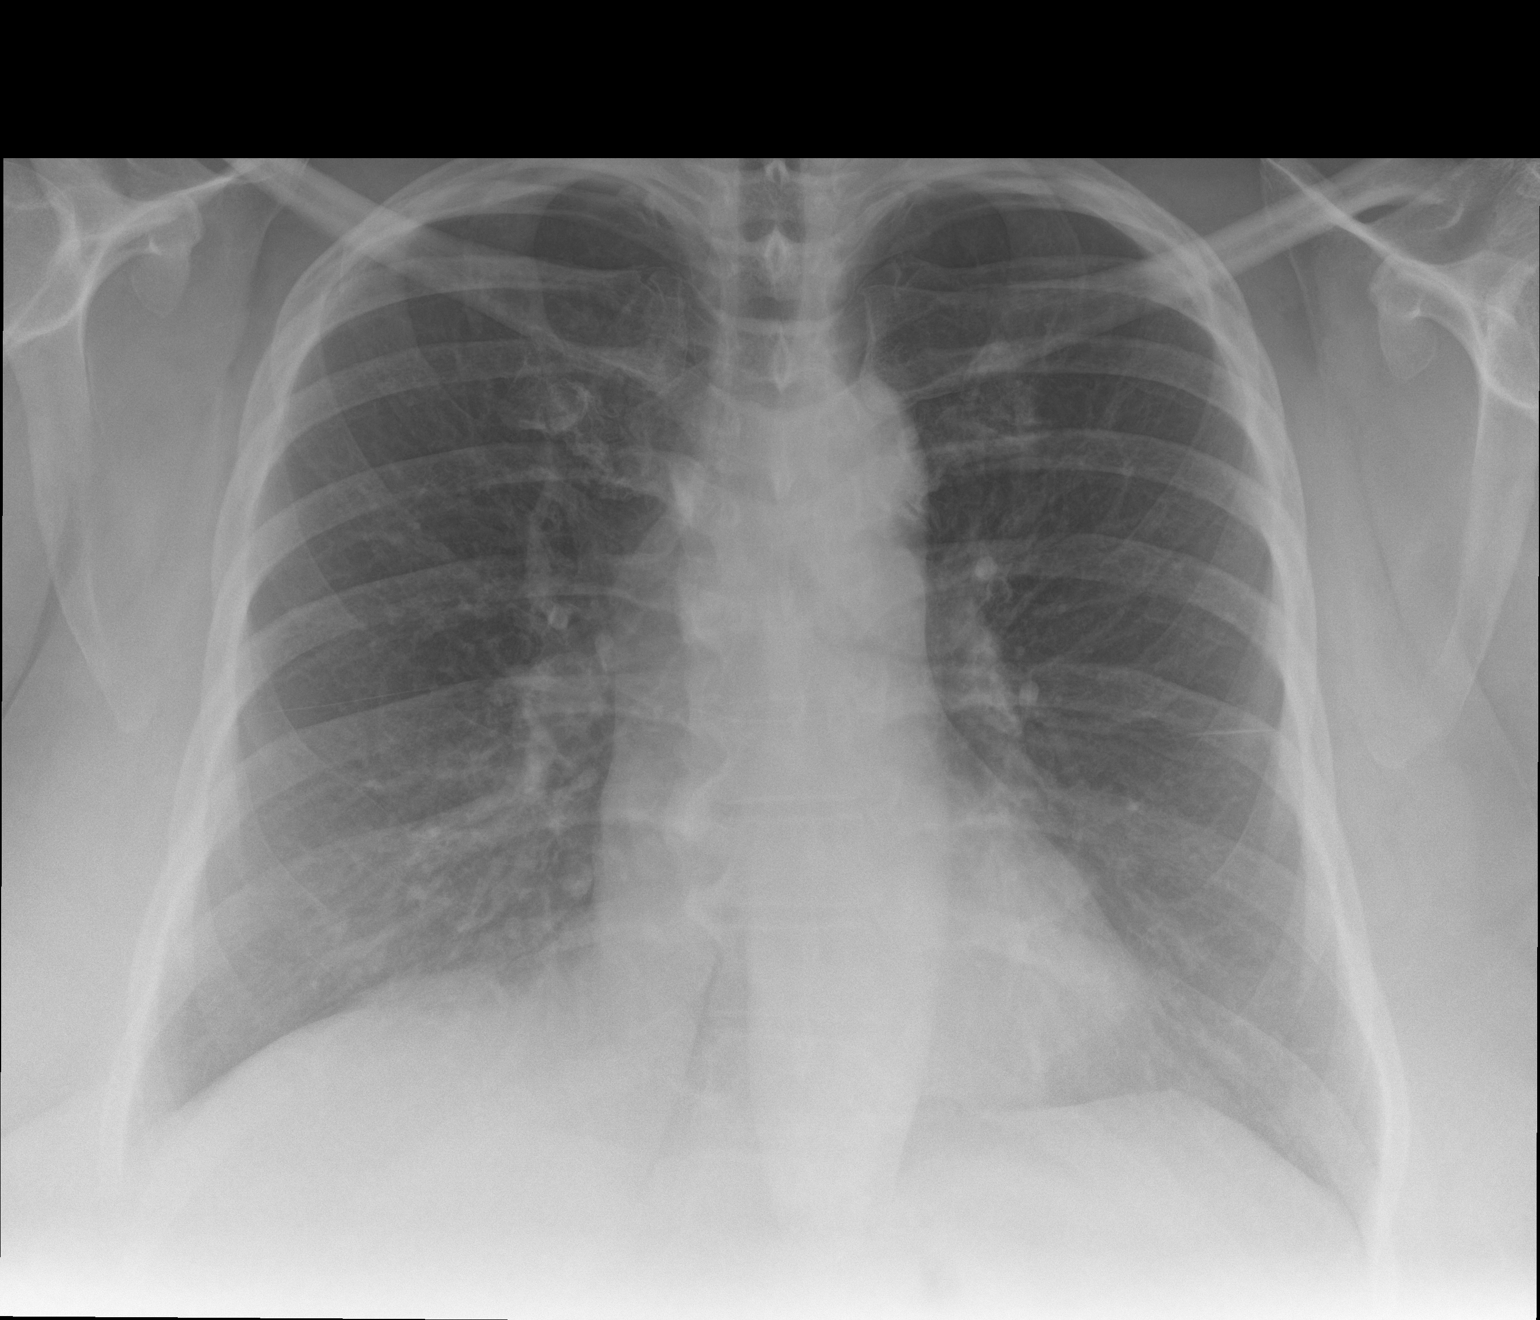

[chest lat]
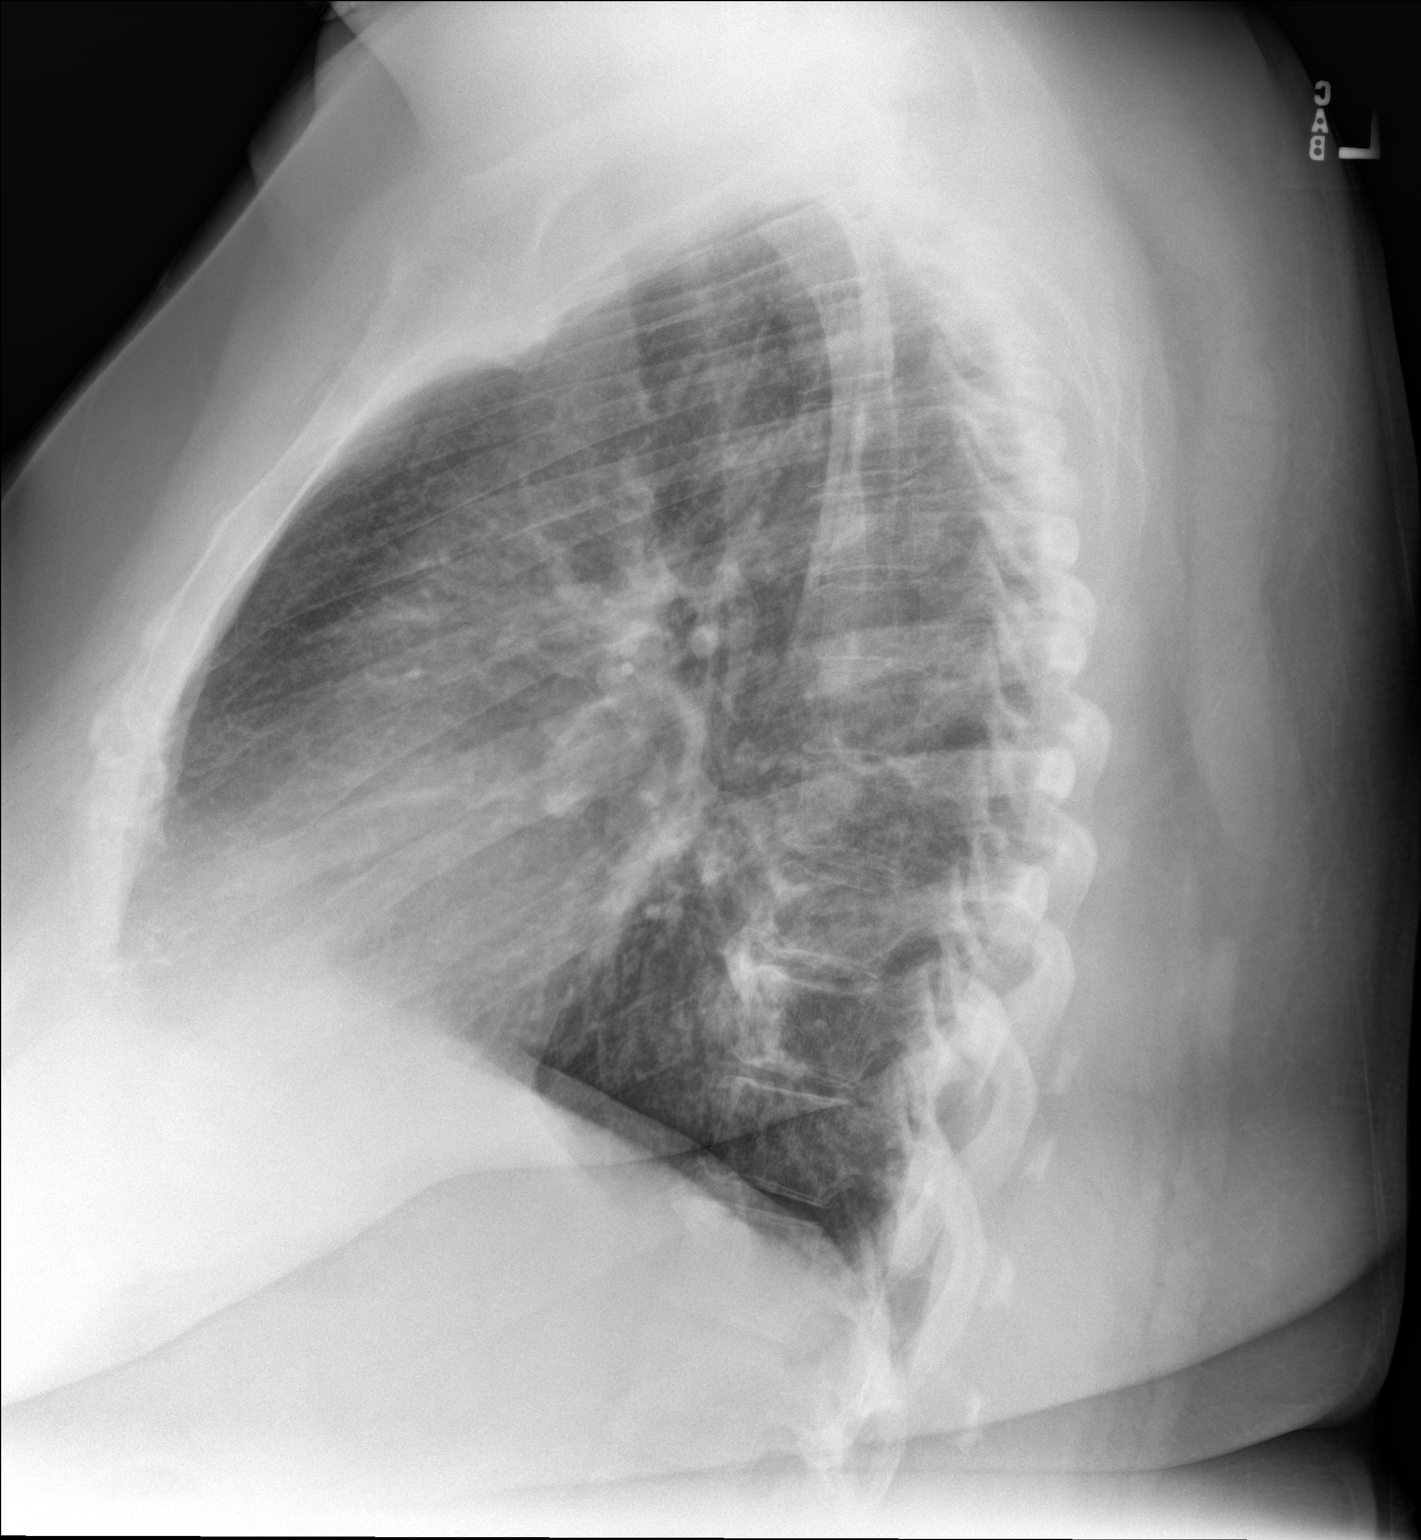

[2 of 2 positions shown; findings below may reference images not displayed]

FINDINGS: Mild elevation of the right hemidiaphragm. No focal airspace disease
or pulmonary edema. Heart and mediastinum are within normal limits.
Trachea is midline. No large pleural effusions. Bridging osteophytes
in lower thoracic spine.
IMPRESSION: No active cardiopulmonary disease.

## 2021-02-15 ENCOUNTER — Ambulatory Visit
Admission: EM | Admit: 2021-02-15 | Discharge: 2021-02-15 | Disposition: A | Discharge status: Other Acute Inpatient Hospital | Payer: BC Managed Care – PPO | Attending: Emergency Medicine | Admitting: Emergency Medicine

## 2021-02-15 ENCOUNTER — Other Ambulatory Visit: Payer: Self-pay

## 2021-02-15 DIAGNOSIS — F419 Anxiety disorder, unspecified: Secondary | ICD-10-CM | POA: Diagnosis present

## 2021-02-15 LAB — GLUCOSE, CAPILLARY: Glucose-Capillary: 93 mg/dL (ref 70–99)

## 2021-02-15 NOTE — Discharge Instructions (Addendum)
Please go to the ER for further evaluation and monitoring of your anxiousness and jittery symptoms.

## 2021-02-15 NOTE — ED Notes (Signed)
Patient is being discharged from the Urgent Care and sent to the Emergency Department via POV . Per Becky Augusta, patient is in need of higher level of care due to need further evaluation. Patient is aware and verbalizes understanding of plan of care.  Vitals:   02/15/21 2005  BP: 134/87  Pulse: 99  Resp: 20  SpO2: 100%

## 2021-02-15 NOTE — ED Triage Notes (Signed)
Patient presents to Urgent Care with complaints of feeling jittery. She states she was driving home when this happened. She states she has a hx of DM. She did not eat anything since breakfast time. She was concerned with her sugar or possible panic attack.  Denies SOB or chest pain.

## 2021-02-15 NOTE — ED Provider Notes (Addendum)
[facility]    CSN: [id number] Arrival date & time: 02/15/21  1945      History   Chief Complaint Chief Complaint  Patient presents with   Hypoglycemia    HPI Dana Wagner is a 56 y.o. female.   HPI  56 year old female here for evaluation of feeling jittery.  Patient reports that she was on her way home from work when she started to feel fatigued and if she was coming down with something.  She had intended to go the grocery store but she opted to go home instead.  When she got home she states that she felt worse and she contacted her neighbor to see if they could bring her to the hospital but they could not so she came here for evaluation.  She is prediabetic, her most recent A1c was 6.1, and she is taking metformin.  She states that she ate breakfast this morning but she is not sure if she ate the rest of the day as she was very busy.  She is concerned that she might have low blood sugar or might also be a panic attack.  She does have a history of anxiety.  She has had panic attacks in the past but states this does not feel like her typical panic attack.  She denies any chest pain, shortness of breath, sweating, headaches, or changes in vision.  Past Medical History:  Diagnosis Date   Asthma    Diabetes mellitus without complication ([place])    Hypertension    Vertigo     There are no problems to display for this patient.   Past Surgical History:  Procedure Laterality Date   ABDOMINAL HYSTERECTOMY      OB History   No obstetric history on file.      Home Medications    Prior to Admission medications   Medication Sig Start Date End Date Taking? Authorizing Provider  albuterol (PROVENTIL) (5 MG/ML) 0.5% nebulizer solution Inhale into the lungs. 01/18/17 01/29/18  [provider]  amLODipine (NORVASC) 10 MG tablet Take 10 mg by mouth daily.    [provider]  cyclobenzaprine (FLEXERIL) 10 MG tablet Take 1 tablet (10 mg total) by mouth at  bedtime. 01/29/18   Norval Gable, MD  diazepam (VALIUM) 2 MG tablet Take 0.5-1 tablets (1-2 mg total) by mouth every 12 (twelve) hours as needed (Vertigo). 05/02/18   Cook, Barnie Del, DO  Fluticasone Furoate-Vilanterol (BREO ELLIPTA IN) Inhale into the lungs.    [provider]  lisinopril (PRINIVIL,ZESTRIL) 5 MG tablet Take 5 mg by mouth daily.    [provider]  metFORMIN (GLUCOPHAGE) 500 MG tablet Take 500 mg by mouth 2 (two) times daily with a meal.    [provider]  PROAIR HFA 108 [phone] Base) MCG/ACT inhaler  11/21/17   [provider]  rosuvastatin (CRESTOR) 5 MG tablet TAKE 1 TABLET BY MOUTH ONCE DAILY FOR CHOLESTEROL 01/24/18   [provider]    Family History Family History  Problem Relation Age of Onset   Cancer Mother        lung   Pneumonia Father     Social History Social History   Tobacco Use   Smoking status: Former   Smokeless tobacco: Never  Scientific laboratory technician Use: Never used  Substance Use Topics   Alcohol use: Yes   Drug use: No     Allergies   Hctz [hydrochlorothiazide], Hydrochlorothiazide w-triamterene, and Oseltamivir   Review  of Systems Review of Systems  Constitutional:  Negative for activity change, appetite change and fever.  Eyes:  Negative for visual disturbance.  Respiratory:  Negative for shortness of breath.   Cardiovascular:  Negative for chest pain.  Gastrointestinal:  Negative for nausea and vomiting.  Skin:  Negative for rash.  Neurological:  Negative for dizziness, syncope, weakness and headaches.  Hematological: Negative.  Does not bruise/bleed easily.  Psychiatric/Behavioral:  The patient is nervous/anxious.     Physical Exam Triage Vital Signs ED Triage Vitals  Enc Vitals Group     BP 02/15/21 2005 134/87     Pulse Rate 02/15/21 2005 99     Resp 02/15/21 2005 20     Temp --      Temp Source 02/15/21 2005 Temporal     SpO2 02/15/21 2005 100 %     Weight --      Height --       Head Circumference --      Peak Flow --      Pain Score 02/15/21 2004 0     Pain Loc --      Pain Edu? --      Excl. in [place]? --    No data found.  Updated Vital Signs BP 134/87 (BP Location: Left Arm)    Pulse 99    Resp 20    SpO2 100%   Visual Acuity Right Eye Distance:   Left Eye Distance:   Bilateral Distance:    Right Eye Near:   Left Eye Near:    Bilateral Near:     Physical Exam Vitals and nursing note reviewed.  Constitutional:      Appearance: Normal appearance.  HENT:     Head: Normocephalic and atraumatic.     Mouth/Throat:     Mouth: Mucous membranes are moist.     Pharynx: Oropharynx is clear. No oropharyngeal exudate or posterior oropharyngeal erythema.  Eyes:     Extraocular Movements: Extraocular movements intact.     Pupils: Pupils are equal, round, and reactive to light.  Cardiovascular:     Rate and Rhythm: Normal rate and regular rhythm.     Pulses: Normal pulses.     Heart sounds: Normal heart sounds.  Pulmonary:     Effort: Pulmonary effort is normal.     Breath sounds: Normal breath sounds. No wheezing, rhonchi or rales.  Skin:    General: Skin is warm and dry.     Capillary Refill: Capillary refill takes less than 2 seconds.     Findings: No erythema or rash.  Neurological:     General: No focal deficit present.     Mental Status: She is alert and oriented to person, place, and time.  Psychiatric:        Mood and Affect: Mood normal.        Behavior: Behavior normal.        Thought Content: Thought content normal.        Judgment: Judgment normal.     UC Treatments / Results  Labs (all labs ordered are listed, but only abnormal results are displayed) Labs Reviewed  GLUCOSE, CAPILLARY  CBG MONITORING, ED    EKG   Radiology No results found.  Procedures Procedures (including critical care time)  Medications Ordered in UC Medications - No data to display  Initial Impression / Assessment and Plan / UC Course  I have  reviewed the triage vital signs and the nursing notes.  Pertinent labs &  imaging results that were available during my care of the patient were reviewed by me and considered in my medical decision making (see chart for details).  Patient came to the urgent care for evaluation of possible hypoglycemia.  She was checked at triage and has a fingerstick glucose of 93.  She states that she thinks is a little low for her but she does not check her blood sugar routinely at home.  She is prediabetic and is taking metformin.  She states that she ate breakfast but she is unsure if she ate today.  Patient is very jittery and shaky and tearful.  Her skin is warm and dry.  Mucous membranes are pink and moist.  Pupils reground reactive and EOMs intact.  Her sclera is also bright and shiny.  Cardiopulmonary exam reveals S1-S2 heart sounds without murmur, rub, or gallop.  Lungs are clear auscultation all fields.  It is unclear as to what is the cause of the patient's symptoms.  It may be that her blood sugar is lower than she is used to it and she is having a hypoglycemic episode, though she was given soda when she first arrived and reports only mild intra improvement in her symptoms.  She may also be experiencing some elements of anxiety.  I gave the patient the option of seeking evaluation in the emergency department where they can draw blood work and do a more in-depth evaluation and monitor her, or going home and consuming some protein to see if elevating her blood sugar more makes her feel better.  Patient is elected to go to the emergency department.  She has a family member who is going to take her via POV.  I believe the patient is stable to go to the ER via POV.   Final Clinical Impressions(s) / UC Diagnoses   Final diagnoses:  Anxiousness     Discharge Instructions      Please go to the ER for further evaluation and monitoring of your anxiousness and jittery symptoms.     ED Prescriptions   None     PDMP not reviewed this encounter.   Margarette Canada, NP 02/15/21 2039    Margarette Canada, NP 02/15/21 2039

## 2022-02-04 ENCOUNTER — Encounter: Payer: Self-pay | Admitting: Emergency Medicine

## 2022-02-04 ENCOUNTER — Ambulatory Visit
Admission: EM | Admit: 2022-02-04 | Discharge: 2022-02-04 | Disposition: A | Discharge status: Home / Self Care | Payer: BC Managed Care – PPO

## 2022-02-04 DIAGNOSIS — I10 Essential (primary) hypertension: Secondary | ICD-10-CM | POA: Diagnosis not present

## 2022-02-04 NOTE — Discharge Instructions (Addendum)
Please go to the emergency department for evaluation of of your headache, dizziness, feeling of being off balance, and elevated blood pressure.

## 2022-02-04 NOTE — ED Triage Notes (Signed)
Patient states that she woke up about 2 hours ago and did not feel well.  Patient states that BP 212/163 before coming here.  Patient states that she took her BP medicine.  Patient denies chest pain or SOB.

## 2022-02-04 NOTE — ED Provider Notes (Signed)
[facility]    CSN: [id number] Arrival date & time: 02/04/22  1943      History   Chief Complaint Chief Complaint  Patient presents with   Hypertension    HPI Dana Wagner is a 57 y.o. female.   HPI  57 year old female here for evaluation of hypertension.  The patient reports that she woke up about 2 hours ago and did not feel well.  She describes not feeling well is feeling dizzy, having headache, and being off balance.  She denies any chest pain, shortness of breath, numbness, tingling, weakness in her extremities.  She does have a history of hypertension and she took her blood pressure at home and it was 212/163.  She came here because she wanted to "check her numbers" before going to the ER.  Past Medical History:  Diagnosis Date   Asthma    Diabetes mellitus without complication (HCC)    Hypertension    Vertigo     There are no problems to display for this patient.   Past Surgical History:  Procedure Laterality Date   ABDOMINAL HYSTERECTOMY      OB History   No obstetric history on file.      Home Medications    Prior to Admission medications   Medication Sig Start Date End Date Taking? Authorizing Provider  amLODipine (NORVASC) 10 MG tablet Take 10 mg by mouth daily.   Yes [provider]  Fluticasone Furoate-Vilanterol (BREO ELLIPTA IN) Inhale into the lungs.   Yes [provider]  lisinopril (PRINIVIL,ZESTRIL) 5 MG tablet Take 5 mg by mouth daily.   Yes [provider]  metFORMIN (GLUCOPHAGE) 500 MG tablet Take 500 mg by mouth 2 (two) times daily with a meal.   Yes [provider]  albuterol (PROVENTIL) (5 MG/ML) 0.5% nebulizer solution Inhale into the lungs. 01/18/17 01/29/18  [provider]  cyclobenzaprine (FLEXERIL) 10 MG tablet Take 1 tablet (10 mg total) by mouth at bedtime. 01/29/18   Payton Mccallum, MD  diazepam (VALIUM) 2 MG tablet Take 0.5-1 tablets (1-2 mg total) by mouth every 12  (twelve) hours as needed (Vertigo). 05/02/18   Tommie Sams, DO  PROAIR HFA 108 [phone] Base) MCG/ACT inhaler  11/21/17   [provider]  rosuvastatin (CRESTOR) 5 MG tablet TAKE 1 TABLET BY MOUTH ONCE DAILY FOR CHOLESTEROL 01/24/18   [provider]    Family History Family History  Problem Relation Age of Onset   Cancer Mother        lung   Pneumonia Father     Social History Social History   Tobacco Use   Smoking status: Former   Smokeless tobacco: Never  Building services engineer Use: Never used  Substance Use Topics   Alcohol use: Yes   Drug use: No     Allergies   Hctz [hydrochlorothiazide], Hydrochlorothiazide w-triamterene, and Oseltamivir   Review of Systems Review of Systems  Respiratory:  Negative for shortness of breath.   Cardiovascular:  Negative for chest pain.  Neurological:  Positive for dizziness and headaches. Negative for weakness and numbness.       Off balance.     Physical Exam Triage Vital Signs ED Triage Vitals  Enc Vitals Group     BP 02/04/22 1955 (!) 164/99     Pulse Rate 02/04/22 1955 100     Resp 02/04/22 1955 15     Temp 02/04/22 1955 98.5 F (36.9 C)  Temp Source 02/04/22 1955 Oral     SpO2 02/04/22 1955 98 %     Weight 02/04/22 1952 250 lb (113.4 kg)     Height 02/04/22 1952 5\' 4"  (1.626 m)     Head Circumference --      Peak Flow --      Pain Score 02/04/22 1952 3     Pain Loc --      Pain Edu? --      Excl. in [place]? --    No data found.  Updated Vital Signs BP (!) 164/99 (BP Location: Right Arm)   Pulse 100   Temp 98.5 F (36.9 C) (Oral)   Resp 15   Ht 5\' 4"  (1.626 m)   Wt 250 lb (113.4 kg)   SpO2 98%   BMI 42.91 kg/m   Visual Acuity Right Eye Distance:   Left Eye Distance:   Bilateral Distance:    Right Eye Near:   Left Eye Near:    Bilateral Near:     Physical Exam Vitals and nursing note reviewed.  Constitutional:      Appearance: Normal appearance. She is obese. She is not  ill-appearing.  Skin:    General: Skin is warm and dry.     Capillary Refill: Capillary refill takes less than 2 seconds.  Neurological:     General: No focal deficit present.     Mental Status: She is alert and oriented to person, place, and time.  Psychiatric:        Mood and Affect: Mood normal.        Behavior: Behavior normal.        Thought Content: Thought content normal.        Judgment: Judgment normal.      UC Treatments / Results  Labs (all labs ordered are listed, but only abnormal results are displayed) Labs Reviewed - No data to display  EKG   Radiology No results found.  Procedures Procedures (including critical care time)  Medications Ordered in UC Medications - No data to display  Initial Impression / Assessment and Plan / UC Course  I have reviewed the triage vital signs and the nursing notes.  Pertinent labs & imaging results that were available during my care of the patient were reviewed by me and considered in my medical decision making (see chart for details).   Patient presents for evaluation of elevated blood pressure with associated symptoms of headache, dizziness, and feeling off balance that started 2 hours ago.  Her blood pressure at home was 212/163 and she was 164/99 here in clinic.  She states that she wanted to come here to the urgent care to "check her numbers" before going to the emergency department if she had to.  I have advised the patient that I do not have any medication to bring her blood pressure down here and given that she is symptomatic I feel she would best be evaluated in the ER.  I do feel she is stable to drive herself via POV as she drove herself to this urgent care.  She states that she will go to [facility] for evaluation of her blood pressure.   Final Clinical Impressions(s) / UC Diagnoses   Final diagnoses:  Hypertension, unspecified type     Discharge Instructions      Please go to the emergency department  for evaluation of of your headache, dizziness, feeling of being off balance, and elevated blood pressure.     ED  Prescriptions   None    PDMP not reviewed this encounter.   Margarette Canada, NP 02/04/22 2009

## 2022-02-04 NOTE — ED Notes (Signed)
Patient is being discharged from the Urgent Care and sent to the Emergency Department via private vehicle . Per Margarette Canada, patient is in need of higher level of care due to Hypertension and HA. Patient is aware and verbalizes understanding of plan of care.  Vitals:   02/04/22 1955  BP: (!) 164/99  Pulse: 100  Resp: 15  Temp: 98.5 F (36.9 C)  SpO2: 98%
